# Patient Record
Sex: Male | Born: 1965 | Race: White | Hispanic: No | Marital: Single | Smoking: Current every day smoker
Health system: Southern US, Community
[De-identification: ages and names within clinical notes are randomized; demographics above are authoritative.]

## PROBLEM LIST (undated history)

## (undated) HISTORY — PX: BRAIN SURGERY: SHX531

---

## 2001-10-24 ENCOUNTER — Encounter: Payer: Self-pay | Admitting: Family Medicine

## 2001-10-24 ENCOUNTER — Ambulatory Visit (HOSPITAL_COMMUNITY): Admission: RE | Admit: 2001-10-24 | Discharge: 2001-10-24 | Payer: Self-pay | Admitting: Family Medicine

## 2005-10-05 ENCOUNTER — Ambulatory Visit (HOSPITAL_COMMUNITY): Admission: RE | Admit: 2005-10-05 | Discharge: 2005-10-05 | Payer: Self-pay | Admitting: Family Medicine

## 2008-08-13 ENCOUNTER — Emergency Department (HOSPITAL_COMMUNITY): Admission: EM | Admit: 2008-08-13 | Discharge: 2008-08-13 | Payer: Self-pay | Admitting: Emergency Medicine

## 2009-01-28 ENCOUNTER — Ambulatory Visit (HOSPITAL_COMMUNITY): Admission: RE | Admit: 2009-01-28 | Discharge: 2009-01-28 | Payer: Self-pay | Admitting: Family Medicine

## 2015-08-27 ENCOUNTER — Encounter (HOSPITAL_COMMUNITY): Payer: Self-pay | Admitting: Emergency Medicine

## 2015-08-27 ENCOUNTER — Emergency Department (HOSPITAL_COMMUNITY)
Admission: EM | Admit: 2015-08-27 | Discharge: 2015-08-27 | Disposition: A | Discharge status: Home / Self Care | Payer: Worker's Compensation | Attending: Emergency Medicine | Admitting: Emergency Medicine

## 2015-08-27 ENCOUNTER — Emergency Department (HOSPITAL_COMMUNITY): Payer: Worker's Compensation

## 2015-08-27 DIAGNOSIS — Y999 Unspecified external cause status: Secondary | ICD-10-CM | POA: Insufficient documentation

## 2015-08-27 DIAGNOSIS — Y939 Activity, unspecified: Secondary | ICD-10-CM | POA: Diagnosis not present

## 2015-08-27 DIAGNOSIS — Y929 Unspecified place or not applicable: Secondary | ICD-10-CM | POA: Diagnosis not present

## 2015-08-27 DIAGNOSIS — F17210 Nicotine dependence, cigarettes, uncomplicated: Secondary | ICD-10-CM | POA: Insufficient documentation

## 2015-08-27 DIAGNOSIS — S93401A Sprain of unspecified ligament of right ankle, initial encounter: Secondary | ICD-10-CM | POA: Diagnosis not present

## 2015-08-27 DIAGNOSIS — S99911A Unspecified injury of right ankle, initial encounter: Secondary | ICD-10-CM | POA: Diagnosis present

## 2015-08-27 DIAGNOSIS — X58XXXA Exposure to other specified factors, initial encounter: Secondary | ICD-10-CM | POA: Diagnosis not present

## 2015-08-27 MED ORDER — HYDROCODONE-ACETAMINOPHEN 5-325 MG PO TABS
1.0000 | ORAL_TABLET | Freq: Once | ORAL | Status: AC
  Administered 2015-08-27: 1 via ORAL
  Filled 2015-08-27: qty 1

## 2015-08-27 MED ORDER — IBUPROFEN 800 MG PO TABS: 800.0000 mg | ORAL_TABLET | Freq: Three times a day (TID) | ORAL | Status: AC

## 2015-08-27 MED ORDER — KETOROLAC TROMETHAMINE 60 MG/2ML IM SOLN
60.0000 mg | Freq: Once | INTRAMUSCULAR | Status: AC
  Administered 2015-08-27: 60 mg via INTRAMUSCULAR
  Filled 2015-08-27: qty 2

## 2015-08-27 NOTE — ED Provider Notes (Signed)
CSN: [id number]     Arrival date & time 08/27/15  0700 History   First MD Initiated Contact with Patient 08/27/15 0720     Chief Complaint  Patient presents with  . Ankle Pain     (Consider location/radiation/quality/duration/timing/severity/associated sxs/prior Treatment) Patient is a [age] y.o. male presenting with ankle pain.  Ankle Pain Location:  Ankle Injury: yes   Mechanism of injury comment:  Rolled it Ankle location:  R ankle Pain details:    Quality:  Aching and sharp   Radiates to:  Does not radiate   Severity:  Mild   Timing:  Constant Chronicity:  New Dislocation: no     History reviewed. No pertinent past medical history. History reviewed. No pertinent past surgical history. History reviewed. No pertinent family history. Social History  Substance Use Topics  . Smoking status: Current Every Day Smoker -- 0.50 packs/day    Types: Cigarettes  . Smokeless tobacco: None  . Alcohol Use: Yes     Comment: occasional    Review of Systems  All other systems reviewed and are negative.     Allergies  Penicillins  Home Medications   Prior to Admission medications   Not on File   BP 134/91 mmHg  Pulse 99  Temp(Src) 98.3 F (36.8 C)  Resp 18  Ht 6\' 1"  (1.854 m)  Wt 220 lb (99.791 kg)  BMI 29.03 kg/m2  SpO2 99% Physical Exam  Constitutional: He is oriented to person, place, and time. He appears well-developed and well-nourished.  HENT:  Head: Normocephalic and atraumatic.  Neck: Normal range of motion.  Cardiovascular: Normal rate.   Pulmonary/Chest: Effort normal. No respiratory distress.  Abdominal: Soft. He exhibits no distension. There is no tenderness.  Musculoskeletal: Normal range of motion. He exhibits tenderness (right lateral ankle). He exhibits no edema.  Neurological: He is alert and oriented to person, place, and time.  Skin: Skin is warm and dry. No rash noted. No erythema.  Nursing note and vitals reviewed.   ED Course  Procedures  (including critical care time) Labs Review Labs Reviewed - No data to display  Imaging Review No results found. I have personally reviewed and evaluated these images and lab results as part of my medical decision-making.   EKG Interpretation None      MDM   Final diagnoses:  None    50 yo m who presents to ED with Right ankle pain and swelling after fall with inversion after stepping over a trailer. On exam has pain with ROM, slight swelling, pain with palpation of malleolus. Difficulty bearing weight for more than a couple steps. So XR done to evaluate for fracture and negative. No proximal fibular ttp to suggest fracture or unstable injury. No laxity in ankle to suggest severe grade 3 sprain.  Plan for air splint, ace wrap. NSAIDs for pain. Will also employ RICE therapy and weight bearing as tolerated. If symptoms not improving in one week, will plan to follow up with PCP or orthopedics for repeat imaging to evaluate for occult fracture.   New Prescriptions: Discharge Medication List as of 08/27/2015  7:46 AM    START taking these medications   Details  ibuprofen (ADVIL,MOTRIN) 800 MG tablet Take 1 tablet (800 mg total) by mouth 3 (three) times daily., Starting 08/27/2015, Until Discontinued, Print         I have personally and contemperaneously reviewed labs and imaging and used in my decision making as above.   A medical screening exam  was performed and I feel the patient has had an appropriate workup for their chief complaint at this time and likelihood of emergent condition existing is low and thus workup can continue on an outpatient basis.. Their vital signs are stable. They have been counseled on decision, discharge, follow up and which symptoms necessitate immediate return to the emergency department.  They verbally stated understanding and agreement with plan and discharged in stable condition.      Marily MemosJason Treyvone Chelf, MD 08/27/15 [phone]

## 2015-08-27 NOTE — ED Notes (Signed)
Pt states he rolled his right ankle at work yesterday.

## 2015-08-27 NOTE — ED Notes (Addendum)
Pt c/o pain to right ankle after "rolling" it while stepping over a trailer yesterday. C/m/s intact. dp pulse 2+. nad noted

## 2016-09-26 ENCOUNTER — Emergency Department (HOSPITAL_COMMUNITY): Payer: Self-pay

## 2016-09-26 ENCOUNTER — Encounter (HOSPITAL_COMMUNITY): Payer: Self-pay | Admitting: Emergency Medicine

## 2016-09-26 ENCOUNTER — Emergency Department (HOSPITAL_COMMUNITY)
Admission: EM | Admit: 2016-09-26 | Discharge: 2016-09-26 | Disposition: A | Discharge status: Home / Self Care | Payer: Self-pay | Attending: Emergency Medicine | Admitting: Emergency Medicine

## 2016-09-26 DIAGNOSIS — Z79899 Other long term (current) drug therapy: Secondary | ICD-10-CM | POA: Insufficient documentation

## 2016-09-26 DIAGNOSIS — M545 Low back pain, unspecified: Secondary | ICD-10-CM

## 2016-09-26 DIAGNOSIS — F17210 Nicotine dependence, cigarettes, uncomplicated: Secondary | ICD-10-CM | POA: Insufficient documentation

## 2016-09-26 MED ORDER — MELOXICAM 7.5 MG PO TABS: 7.5000 mg | ORAL_TABLET | Freq: Every day | ORAL | 0 refills | Status: AC

## 2016-09-26 MED ORDER — OXYCODONE-ACETAMINOPHEN 5-325 MG PO TABS
1.0000 | ORAL_TABLET | Freq: Once | ORAL | Status: AC
  Administered 2016-09-26: 1 via ORAL
  Filled 2016-09-26: qty 1

## 2016-09-26 MED ORDER — CYCLOBENZAPRINE HCL 10 MG PO TABS: 10.0000 mg | ORAL_TABLET | Freq: Two times a day (BID) | ORAL | 0 refills | Status: AC | PRN

## 2016-09-26 NOTE — ED Provider Notes (Signed)
[facility]-EMERGENCY DEPT Provider Note   CSN: [id number] Arrival date & time: 09/26/16  1029     History   Chief Complaint Chief Complaint  Patient presents with  . Back Pain    HPI Reginald Jacobson is a 51 y.o. male.  HPI  Patient presents to ED for evaluation of the low back pain for the past 3 days. He reports onset at work after increased physical activity. States pain is worse with walking or movement. Has tried Aleve, icy hot patch, ice pack and heat with no improvement in symptoms. He denies any hematuria, urinary incontinence, dysuria, history of back surgery, history of IV drug use, history of cancer, nausea, vomiting, numbness, weakness.   History reviewed. No pertinent past medical history.  There are no active problems to display for this patient.   Past Surgical History:  Procedure Laterality Date  . BRAIN SURGERY         Home Medications    Prior to Admission medications   Medication Sig Start Date End Date Taking? Authorizing Provider  cyclobenzaprine (FLEXERIL) 10 MG tablet Take 1 tablet (10 mg total) by mouth 2 (two) times daily as needed for muscle spasms. 09/26/16   Ladaysha Soutar, PA-C  ibuprofen (ADVIL,MOTRIN) 800 MG tablet Take 1 tablet (800 mg total) by mouth 3 (three) times daily. 08/27/15   Mesner, Barbara CowerJason, MD  meloxicam (MOBIC) 7.5 MG tablet Take 1 tablet (7.5 mg total) by mouth daily. 09/26/16   Dietrich PatesKhatri, Wilbon Obenchain, PA-C    Family History History reviewed. No pertinent family history.  Social History Social History  Substance Use Topics  . Smoking status: Current Every Day Smoker    Packs/day: 0.50    Types: Cigarettes  . Smokeless tobacco: Never Used  . Alcohol use Yes     Comment: occasional     Allergies   Penicillins   Review of Systems Review of Systems  Constitutional: Negative for chills and fever.  Gastrointestinal: Negative for nausea and vomiting.  Genitourinary: Negative for dysuria, frequency, hematuria, penile swelling, scrotal  swelling, testicular pain and urgency.  Musculoskeletal: Positive for back pain.  Skin: Negative for pallor and wound.  Neurological: Negative for headaches.     Physical Exam Updated Vital Signs BP 120/80 (BP Location: Right Arm)   Pulse 70   Temp 98.2 F (36.8 C) (Oral)   Resp 16   SpO2 97%   Physical Exam  Constitutional: He appears well-developed and well-nourished. No distress.  HENT:  Head: Normocephalic and atraumatic.  Eyes: Conjunctivae and EOM are normal. No scleral icterus.  Neck: Normal range of motion.  Pulmonary/Chest: Effort normal. No respiratory distress.  Musculoskeletal: Normal range of motion. He exhibits tenderness. He exhibits no edema or deformity.  Lumbar tenderness at midline and paraspinal musculature R > L. No midline spinal tenderness present in thoracic or cervical spine. No step-off palpated. No visible bruising, edema or temperature change noted. No objective signs of numbness present. No saddle anesthesia. 2+ DP pulses bilaterally. Sensation intact to light touch. Strength 5/5 in bilateral lower extremities.   Neurological: He is alert.  Skin: No rash noted. He is not diaphoretic.  Psychiatric: He has a normal mood and affect.  Nursing note and vitals reviewed.    ED Treatments / Results  Labs (all labs ordered are listed, but only abnormal results are displayed) Labs Reviewed - No data to display  EKG  EKG Interpretation None       Radiology Dg Lumbar Spine Complete  Result Date:  09/26/2016 CLINICAL DATA:  Low back pain. EXAM: LUMBAR SPINE - COMPLETE 4+ VIEW COMPARISON:  None. FINDINGS: There is no evidence of lumbar spine fracture. Alignment is normal. Intervertebral disc spaces are maintained. IMPRESSION: Negative. Electronically Signed   By: Signa Kellaylor  Stroud M.D.   On: 09/26/2016 12:15    Procedures Procedures (including critical care time)  Medications Ordered in ED Medications  oxyCODONE-acetaminophen (PERCOCET/ROXICET) 5-325  MG per tablet 1 tablet (1 tablet Oral Given 09/26/16 1133)     Initial Impression / Assessment and Plan / ED Course  I have reviewed the triage vital signs and the nursing notes.  Pertinent labs & imaging results that were available during my care of the patient were reviewed by me and considered in my medical decision making (see chart for details).     Patient presents to ED for evaluation of low back pain for the past 3 days. No history of injury to the area. Patient is ambulatory. He denies any numbness, weakness, urinary or bowel incontinence. Is afebrile with no history of fever. No history of previous back surgery, cancer or IV drug use. Low suspicion for cauda equina or other spinal cord injury being the cause of his symptoms. On physical exam there is tenderness to palpation in the lumbar area. Sensation intact to light touch. Strength 5/5 in bilateral lower extremities. X-rays of lumbar spine were obtained were negative for fracture dislocation. Symptoms likely due to muscle strain or spasm in the area. We'll advise patient to take anti-inflammatory, muscle relaxer, apply heat and stretch area as tolerated. Patient appears stable for discharge at this time. Strict return precautions given.  Final Clinical Impressions(s) / ED Diagnoses   Final diagnoses:  Acute midline low back pain without sciatica    New Prescriptions Discharge Medication List as of 09/26/2016 12:36 PM    START taking these medications   Details  cyclobenzaprine (FLEXERIL) 10 MG tablet Take 1 tablet (10 mg total) by mouth 2 (two) times daily as needed for muscle spasms., Starting Sat 09/26/2016, Print    meloxicam (MOBIC) 7.5 MG tablet Take 1 tablet (7.5 mg total) by mouth daily., Starting Sat 09/26/2016, Print         BeldingKhatri, AllentonHina, PA-C 09/26/16 1313    Samuel JesterMcManus, Kathleen, DO 09/28/16 2157

## 2016-09-26 NOTE — Discharge Instructions (Signed)
Take Flexeril and Mobic as directed. Apply heat and stretch area as tolerated. Return to ED for worsening pain, injuries, numbness, weakness, trouble walking, loss of bladder function.

## 2016-09-26 NOTE — ED Triage Notes (Signed)
Pt started having lower back pain Thursday. States just has been getting worse. Denies radiation or gu sx. Pain 10 with movment but 0 still. Nad.

## 2016-09-26 NOTE — ED Notes (Signed)
Pt from radiology using a walker- He ambulates heel to toe without stagger or drift He then was given a wheelchair to sit upon and had great difficulty flexing his knees to get down into the wheelchair  He denies pain to legs or thighs while sitting in chair

## 2016-09-26 NOTE — ED Notes (Signed)
Pt reports back pain  Has requested to stay in the wheelchair as it is too painful to move

## 2017-06-18 IMAGING — DX DG ANKLE COMPLETE 3+V*R*
3 series · 3 of 3 positions shown · non-contrast
Comparison: None.

CLINICAL DATA: Inversion type injury with pain

EXAM:
RIGHT ANKLE - COMPLETE 3+ VIEW

[ankle ap]
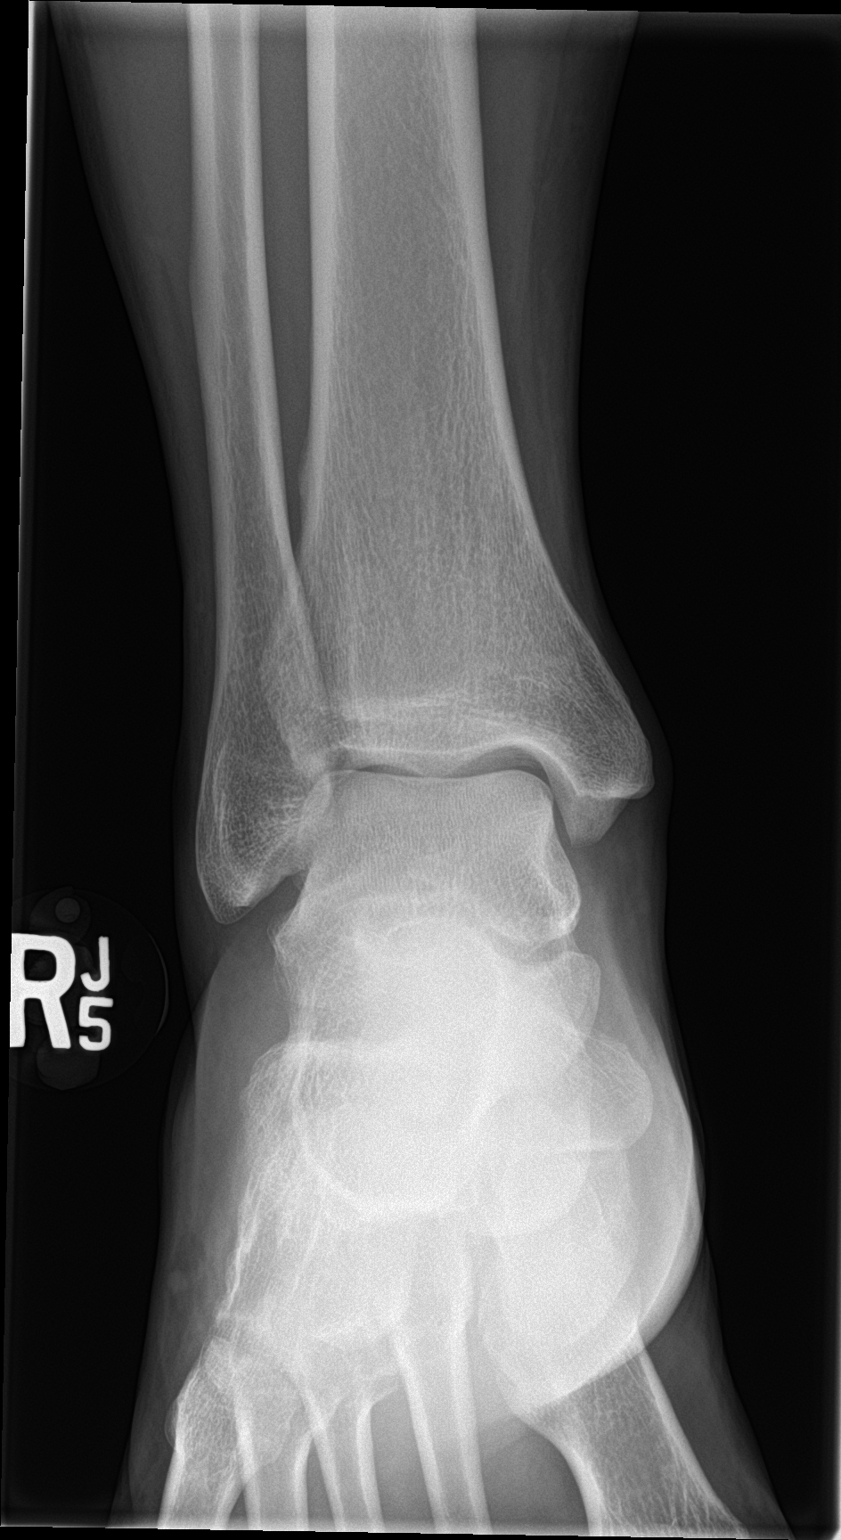

[ankle obl]
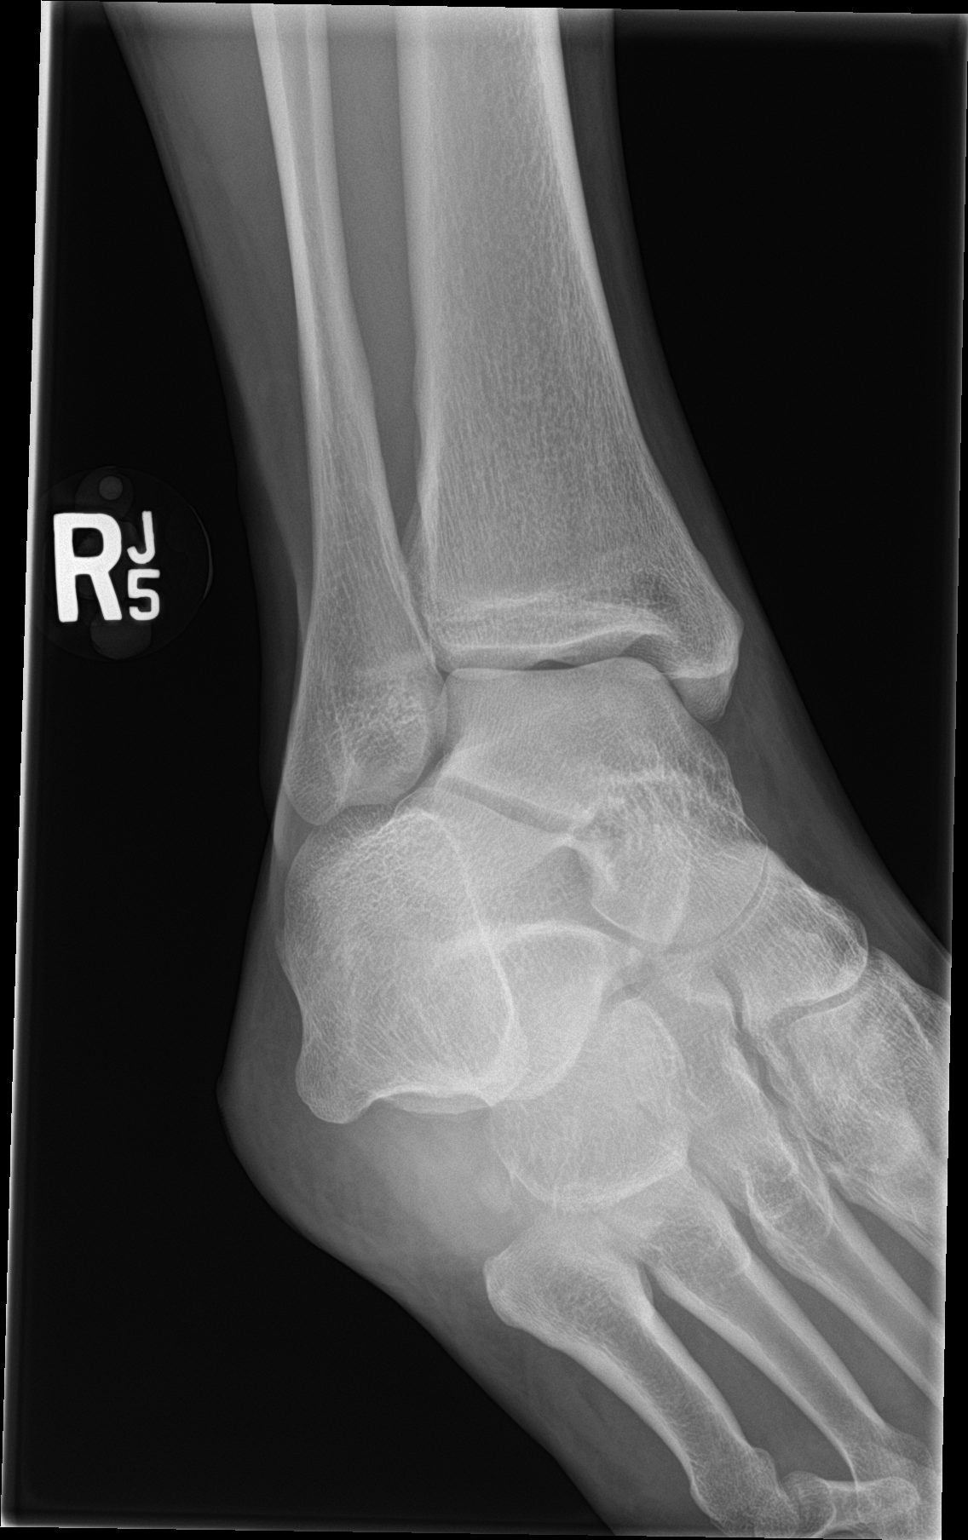

[ankle lat]
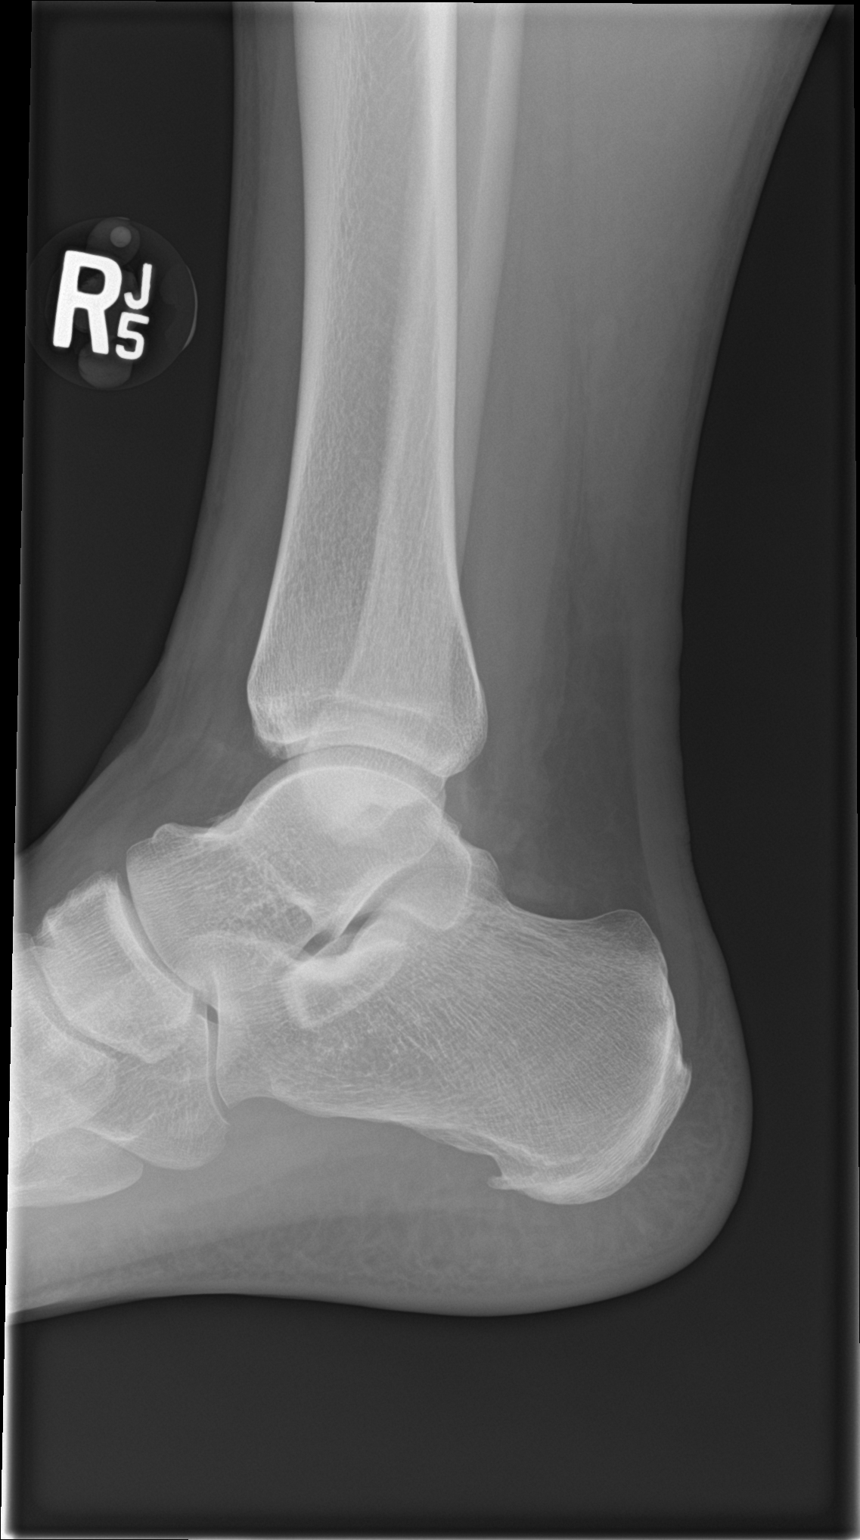

[3 of 3 positions shown; findings below may reference images not displayed]

FINDINGS: Frontal, oblique, and lateral views were obtained. There is no
appreciable fracture or joint effusion. The ankle mortise appears
intact. No appreciable joint space narrowing. There is a small
inferior calcaneal spur.
IMPRESSION: Small inferior calcaneal spur. No demonstrable fracture. Ankle
mortise appears intact.

## 2018-07-19 IMAGING — DX DG LUMBAR SPINE COMPLETE 4+V
5 series · 5 of 5 positions shown · non-contrast
Comparison: None.

CLINICAL DATA: Low back pain.

EXAM:
LUMBAR SPINE - COMPLETE 4+ VIEW

[l-spine ap]
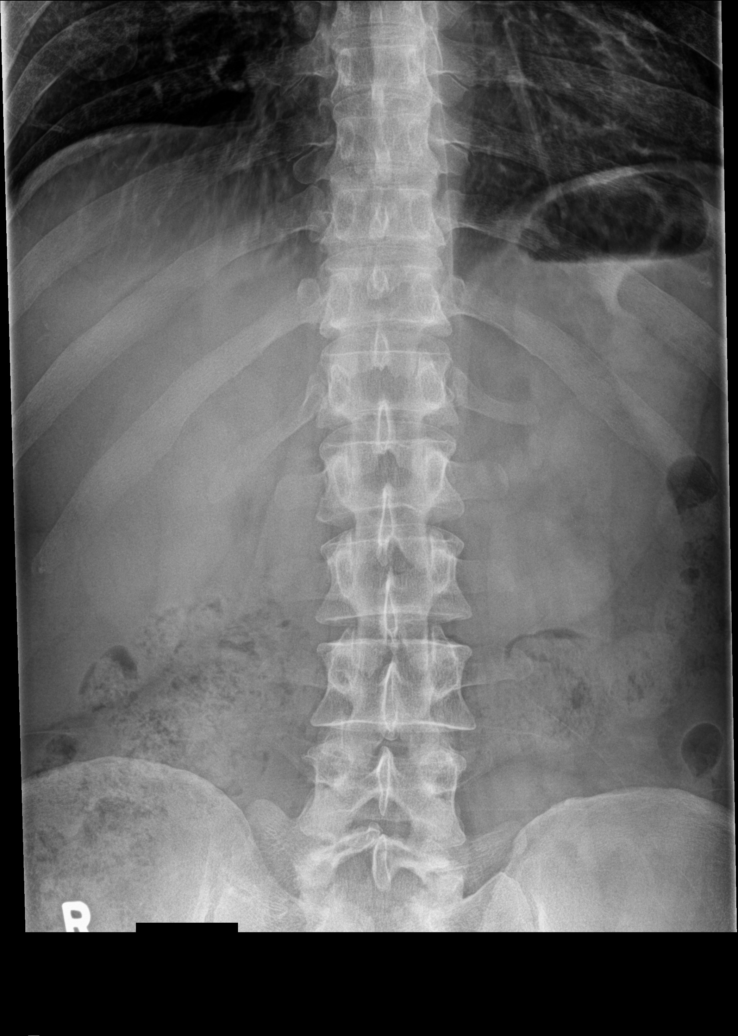

[l-spine obl (1 of 2)]
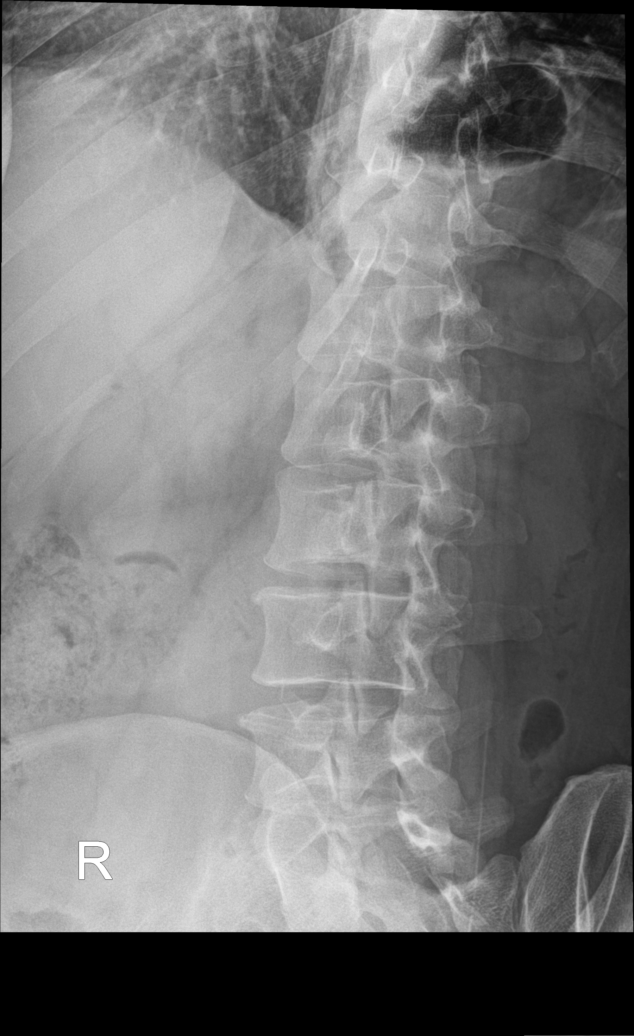

[l-spine obl (2 of 2)]
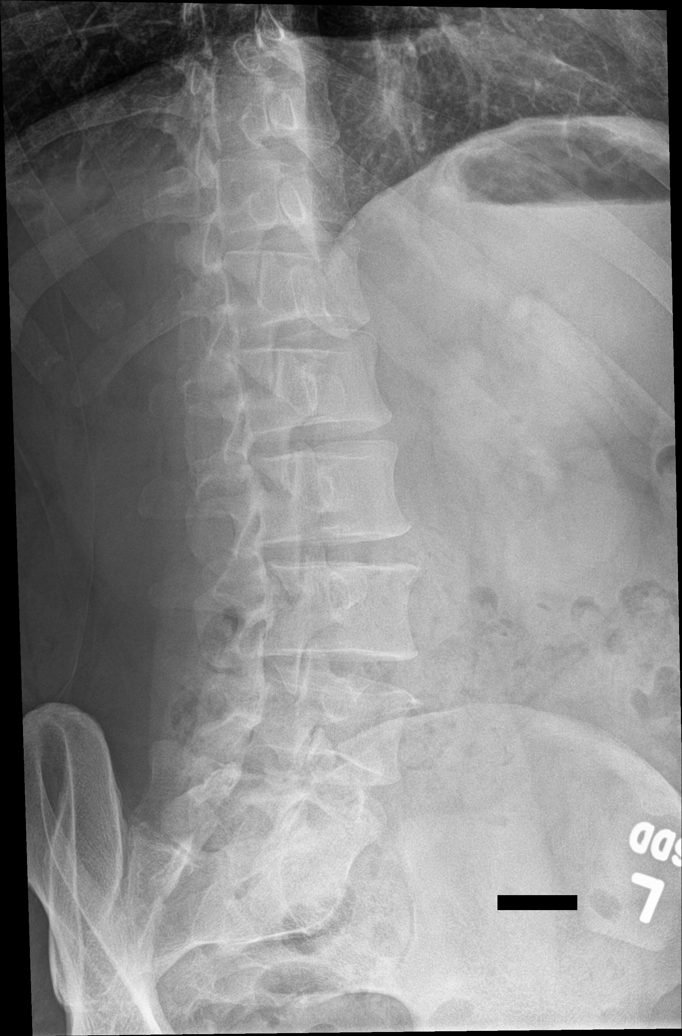

[l-spine lat]
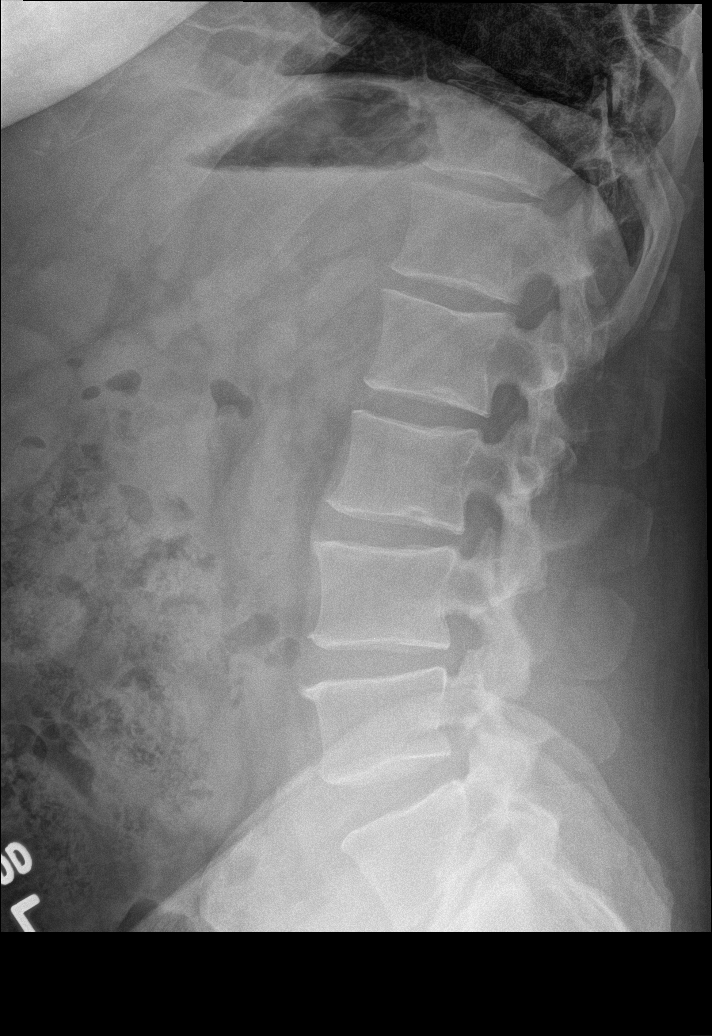

[l-spine spot]
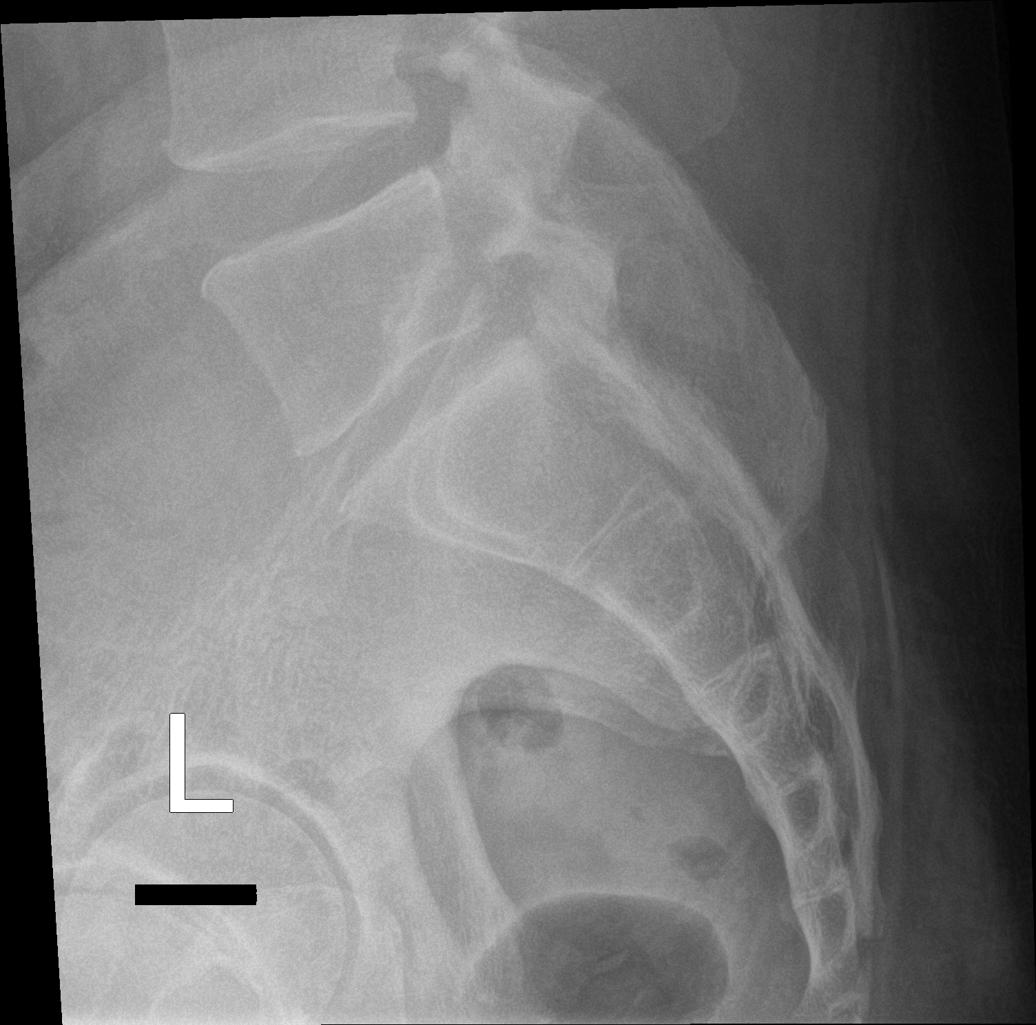

[5 of 5 positions shown; findings below may reference images not displayed]

FINDINGS: There is no evidence of lumbar spine fracture. Alignment is normal.
Intervertebral disc spaces are maintained.
IMPRESSION: Negative.
